# Patient Record
Sex: Female | Born: 1995 | Race: Black or African American | Hispanic: No | Marital: Single | Smoking: Never smoker
Health system: Southern US, Community
[De-identification: ages and names within clinical notes are randomized; demographics above are authoritative.]

## PROBLEM LIST (undated history)

## (undated) DIAGNOSIS — K5900 Constipation, unspecified: Secondary | ICD-10-CM

## (undated) DIAGNOSIS — R002 Palpitations: Secondary | ICD-10-CM

## (undated) HISTORY — DX: Constipation, unspecified: K59.00

## (undated) HISTORY — DX: Palpitations: R00.2

---

## 2016-03-06 DIAGNOSIS — N61 Mastitis without abscess: Secondary | ICD-10-CM | POA: Diagnosis not present

## 2016-04-09 DIAGNOSIS — N61 Mastitis without abscess: Secondary | ICD-10-CM | POA: Diagnosis not present

## 2016-04-22 DIAGNOSIS — Z113 Encounter for screening for infections with a predominantly sexual mode of transmission: Secondary | ICD-10-CM | POA: Diagnosis not present

## 2016-04-22 DIAGNOSIS — Z01419 Encounter for gynecological examination (general) (routine) without abnormal findings: Secondary | ICD-10-CM | POA: Diagnosis not present

## 2016-05-29 DIAGNOSIS — L2084 Intrinsic (allergic) eczema: Secondary | ICD-10-CM | POA: Diagnosis not present

## 2016-10-28 DIAGNOSIS — L929 Granulomatous disorder of the skin and subcutaneous tissue, unspecified: Secondary | ICD-10-CM | POA: Diagnosis not present

## 2016-11-07 DIAGNOSIS — R002 Palpitations: Secondary | ICD-10-CM | POA: Diagnosis not present

## 2016-11-07 DIAGNOSIS — R079 Chest pain, unspecified: Secondary | ICD-10-CM | POA: Diagnosis not present

## 2016-11-14 DIAGNOSIS — R002 Palpitations: Secondary | ICD-10-CM | POA: Diagnosis not present

## 2016-11-18 ENCOUNTER — Telehealth: Payer: Self-pay

## 2016-11-18 NOTE — Telephone Encounter (Signed)
Sent notes to scheduling 

## 2016-12-09 DIAGNOSIS — Z309 Encounter for contraceptive management, unspecified: Secondary | ICD-10-CM | POA: Diagnosis not present

## 2016-12-09 DIAGNOSIS — K219 Gastro-esophageal reflux disease without esophagitis: Secondary | ICD-10-CM | POA: Diagnosis not present

## 2016-12-09 DIAGNOSIS — F411 Generalized anxiety disorder: Secondary | ICD-10-CM | POA: Diagnosis not present

## 2016-12-09 DIAGNOSIS — Z3202 Encounter for pregnancy test, result negative: Secondary | ICD-10-CM | POA: Diagnosis not present

## 2016-12-19 ENCOUNTER — Other Ambulatory Visit: Payer: Self-pay | Admitting: Family Medicine

## 2016-12-19 DIAGNOSIS — Z309 Encounter for contraceptive management, unspecified: Secondary | ICD-10-CM | POA: Diagnosis not present

## 2016-12-19 DIAGNOSIS — E010 Iodine-deficiency related diffuse (endemic) goiter: Secondary | ICD-10-CM

## 2016-12-19 DIAGNOSIS — R634 Abnormal weight loss: Secondary | ICD-10-CM | POA: Diagnosis not present

## 2016-12-19 DIAGNOSIS — K5900 Constipation, unspecified: Secondary | ICD-10-CM | POA: Diagnosis not present

## 2016-12-30 ENCOUNTER — Ambulatory Visit
Admission: RE | Admit: 2016-12-30 | Discharge: 2016-12-30 | Disposition: A | Discharge status: Home / Self Care | Payer: BLUE CROSS/BLUE SHIELD | Source: Ambulatory Visit | Attending: Family Medicine | Admitting: Family Medicine

## 2016-12-30 DIAGNOSIS — E010 Iodine-deficiency related diffuse (endemic) goiter: Secondary | ICD-10-CM | POA: Diagnosis not present

## 2016-12-31 DIAGNOSIS — R079 Chest pain, unspecified: Secondary | ICD-10-CM | POA: Insufficient documentation

## 2016-12-31 DIAGNOSIS — R002 Palpitations: Secondary | ICD-10-CM | POA: Insufficient documentation

## 2017-01-01 ENCOUNTER — Ambulatory Visit (INDEPENDENT_AMBULATORY_CARE_PROVIDER_SITE_OTHER): Payer: BLUE CROSS/BLUE SHIELD | Admitting: Internal Medicine

## 2017-01-01 ENCOUNTER — Encounter: Payer: Self-pay | Admitting: Internal Medicine

## 2017-01-01 ENCOUNTER — Encounter (INDEPENDENT_AMBULATORY_CARE_PROVIDER_SITE_OTHER): Payer: Self-pay

## 2017-01-01 VITALS — BP 110/66 | HR 72 | Ht 66.0 in | Wt 146.4 lb

## 2017-01-01 DIAGNOSIS — K5900 Constipation, unspecified: Secondary | ICD-10-CM

## 2017-01-01 DIAGNOSIS — K219 Gastro-esophageal reflux disease without esophagitis: Secondary | ICD-10-CM | POA: Diagnosis not present

## 2017-01-01 NOTE — Progress Notes (Signed)
Cardiology Office Note   Date:  01/01/2017   ID:  Kaitlyn Fields, DOB 1996-06-18, MRN [id number]  PCP:  Gaye AlkenBARNES,ELIZABETH STEWART, MD  Cardiologist:   Dietrich PatesPaula Maricella Filyaw, MD   Pt referred for palpitations and chest pressure     History of Present Illness: Kaitlyn Fields is a 21 y.o. female with a history of palpitations    Pt woke up in Dec Heart racing  Lasted 5 to 10 min  No dizziness  Hands shaking  Calmed down  COuldnt sleep after.  Tired later next day   No dizziness   Since then has had heart beating flast fut lasting only for a few seconds   No dizziness   Dec  Chest tightness  Pain came sharp or stabbing during first spell of heart racing   Since then can have at any time  No exacerbating factors  Last about 10 to 20 sec .  NO SOB   Doe have a lot of burping  Food/water will come with it  Has had GI symptoms since December   Has had problems with BMs  Took Mag then Miralax  Miralax is Helping  When not having spells is OK  Doesn't exercise regularly but is active as student No CP with activity     Current Meds  Medication Sig  . calcium carbonate (TUMS - DOSED IN MG ELEMENTAL CALCIUM) 500 MG chewable tablet Chew 1 tablet by mouth as directed.  . Norethindrone-Ethinyl Estradiol-Fe Biphas (LO LOESTRIN FE) 1 MG-10 MCG / 10 MCG tablet Take 1 tablet by mouth daily.  . polyethylene glycol (MIRALAX / GLYCOLAX) packet Take 17 g by mouth daily as needed.     Allergies:   Patient has no known allergies.   Past Medical History:  Diagnosis Date  . Constipation   . Palpitations     History reviewed. No pertinent surgical history.   Social History:  The patient  reports that she has never smoked. She has never used smokeless tobacco. She reports that she does not drink alcohol or use drugs.   Family History:  The patient's family history includes Diabetes in her father and maternal grandfather; Healthy in her brother and sister; Hypertension in her father.    ROS:  Please see the  history of present illness. All other systems are reviewed and  Negative to the above problem except as noted.    PHYSICAL EXAM: VS:  BP 110/66   Pulse 72   Ht 5\' 6"  (1.676 m)   Wt 146 lb 6.4 oz (66.4 kg)   BMI 23.63 kg/m   GEN: Well nourished, well developed, in no acute distress  HEENT: normal  Neck: no JVD, carotid bruits, or masses Cardiac: RRR; no murmurs, rubs, or gallops,no edema  Respiratory:  clear to auscultation bilaterally, normal work of breathing GI: soft, nontender, nondistended, + BS  No hepatomegaly  MS: no deformity Moving all extremities   Skin: warm and dry, no rash Neuro:  Strength and sensation are intact Psych: euthymic mood, full affect   EKG:  EKG is ordered today.SR 72 bpm     Lipid Panel No results found for: CHOL, TRIG, HDL, CHOLHDL, VLDL, LDLCALC, LDLDIRECT    Wt Readings from Last 3 Encounters:  01/01/17 146 lb 6.4 oz (66.4 kg)      ASSESSMENT AND PLAN:  1  Palpitations  I am not convinced this represents a signif arrhythmia  Short spells   I would encourage her to stay hydrated,  stay active   If she has any longer spells like the first spell she had then I would set up for an event monitor.    2 Chest tightness  This does not sound like cardiac   Sound GI in origin I woud recom referred to GI to evaluate reflux    Will be available if develops prolonged symptoms palpitations     Current medicines are reviewed at length with the patient today.  The patient does not have concerns regarding medicines.  Signed, Dietrich Pates, MD  01/01/2017 4:09 PM    [facility] [address] [place], [place], [place]  [address] Phone: [phone]; Fax: [phone]

## 2017-01-01 NOTE — Patient Instructions (Signed)
Please call back if palpitations continue or worsen. Dr. Tenny Crawoss recommends that you follow up with gastroenterology.

## 2017-01-12 ENCOUNTER — Ambulatory Visit: Payer: Self-pay | Admitting: Cardiology

## 2017-01-19 DIAGNOSIS — K219 Gastro-esophageal reflux disease without esophagitis: Secondary | ICD-10-CM | POA: Diagnosis not present

## 2017-01-19 DIAGNOSIS — K5900 Constipation, unspecified: Secondary | ICD-10-CM | POA: Diagnosis not present

## 2017-02-23 DIAGNOSIS — K219 Gastro-esophageal reflux disease without esophagitis: Secondary | ICD-10-CM | POA: Diagnosis not present

## 2017-02-23 DIAGNOSIS — K5900 Constipation, unspecified: Secondary | ICD-10-CM | POA: Diagnosis not present

## 2017-04-17 ENCOUNTER — Other Ambulatory Visit: Payer: Self-pay | Admitting: Gastroenterology

## 2017-04-17 DIAGNOSIS — K5900 Constipation, unspecified: Secondary | ICD-10-CM | POA: Diagnosis not present

## 2017-04-17 DIAGNOSIS — K219 Gastro-esophageal reflux disease without esophagitis: Secondary | ICD-10-CM | POA: Diagnosis not present

## 2017-04-23 ENCOUNTER — Other Ambulatory Visit: Payer: BLUE CROSS/BLUE SHIELD

## 2017-04-28 ENCOUNTER — Other Ambulatory Visit: Payer: BLUE CROSS/BLUE SHIELD

## 2017-05-05 ENCOUNTER — Ambulatory Visit
Admission: RE | Admit: 2017-05-05 | Discharge: 2017-05-05 | Disposition: A | Discharge status: Home / Self Care | Payer: BLUE CROSS/BLUE SHIELD | Source: Ambulatory Visit | Attending: Gastroenterology | Admitting: Gastroenterology

## 2017-05-05 DIAGNOSIS — K5900 Constipation, unspecified: Secondary | ICD-10-CM | POA: Diagnosis not present

## 2017-05-11 ENCOUNTER — Ambulatory Visit
Admission: RE | Admit: 2017-05-11 | Discharge: 2017-05-11 | Disposition: A | Discharge status: Home / Self Care | Payer: BLUE CROSS/BLUE SHIELD | Source: Ambulatory Visit | Attending: Gastroenterology | Admitting: Gastroenterology

## 2017-05-11 DIAGNOSIS — K5900 Constipation, unspecified: Secondary | ICD-10-CM

## 2017-05-11 DIAGNOSIS — K219 Gastro-esophageal reflux disease without esophagitis: Secondary | ICD-10-CM | POA: Diagnosis not present

## 2017-05-19 ENCOUNTER — Other Ambulatory Visit (HOSPITAL_COMMUNITY)
Admission: RE | Admit: 2017-05-19 | Discharge: 2017-05-19 | Disposition: A | Discharge status: Home / Self Care | Payer: BLUE CROSS/BLUE SHIELD | Source: Ambulatory Visit | Attending: Nurse Practitioner | Admitting: Nurse Practitioner

## 2017-05-19 ENCOUNTER — Other Ambulatory Visit: Payer: Self-pay | Admitting: Nurse Practitioner

## 2017-05-19 DIAGNOSIS — Z113 Encounter for screening for infections with a predominantly sexual mode of transmission: Secondary | ICD-10-CM | POA: Diagnosis not present

## 2017-05-19 DIAGNOSIS — Z01419 Encounter for gynecological examination (general) (routine) without abnormal findings: Secondary | ICD-10-CM | POA: Diagnosis not present

## 2017-05-19 DIAGNOSIS — Z124 Encounter for screening for malignant neoplasm of cervix: Secondary | ICD-10-CM | POA: Diagnosis not present

## 2017-05-19 DIAGNOSIS — R42 Dizziness and giddiness: Secondary | ICD-10-CM | POA: Diagnosis not present

## 2017-05-19 DIAGNOSIS — R634 Abnormal weight loss: Secondary | ICD-10-CM | POA: Diagnosis not present

## 2017-05-21 LAB — CYTOLOGY - PAP
Chlamydia: NEGATIVE
DIAGNOSIS: NEGATIVE
Neisseria Gonorrhea: NEGATIVE

## 2017-05-29 DIAGNOSIS — K219 Gastro-esophageal reflux disease without esophagitis: Secondary | ICD-10-CM | POA: Diagnosis not present

## 2017-09-28 ENCOUNTER — Other Ambulatory Visit: Payer: Self-pay | Admitting: Family Medicine

## 2017-09-28 DIAGNOSIS — N63 Unspecified lump in unspecified breast: Secondary | ICD-10-CM

## 2017-10-05 ENCOUNTER — Other Ambulatory Visit: Payer: BLUE CROSS/BLUE SHIELD

## 2018-02-27 DIAGNOSIS — R42 Dizziness and giddiness: Secondary | ICD-10-CM | POA: Diagnosis not present

## 2018-02-27 DIAGNOSIS — E559 Vitamin D deficiency, unspecified: Secondary | ICD-10-CM | POA: Diagnosis not present

## 2018-02-27 DIAGNOSIS — R197 Diarrhea, unspecified: Secondary | ICD-10-CM | POA: Diagnosis not present

## 2018-02-27 DIAGNOSIS — R05 Cough: Secondary | ICD-10-CM | POA: Diagnosis not present

## 2018-02-27 DIAGNOSIS — E538 Deficiency of other specified B group vitamins: Secondary | ICD-10-CM | POA: Diagnosis not present

## 2018-02-27 DIAGNOSIS — R110 Nausea: Secondary | ICD-10-CM | POA: Diagnosis not present

## 2018-06-22 DIAGNOSIS — Z01419 Encounter for gynecological examination (general) (routine) without abnormal findings: Secondary | ICD-10-CM | POA: Diagnosis not present

## 2018-06-22 DIAGNOSIS — Z113 Encounter for screening for infections with a predominantly sexual mode of transmission: Secondary | ICD-10-CM | POA: Diagnosis not present

## 2018-12-31 DIAGNOSIS — H00016 Hordeolum externum left eye, unspecified eyelid: Secondary | ICD-10-CM | POA: Diagnosis not present

## 2018-12-31 DIAGNOSIS — Z7189 Other specified counseling: Secondary | ICD-10-CM | POA: Diagnosis not present

## 2019-03-20 IMAGING — RF DG UGI W/ HIGH DENSITY W/KUB
7 of 8 series · 14 of 24 positions shown · non-contrast
Comparison: None in PACs

CLINICAL DATA: Constipation, gastroesophageal reflux.

EXAM:
UPPER GI SERIES WITH KUB
TECHNIQUE: After obtaining a scout radiograph a routine upper GI series was
performed using thin and high density barium. Effervescent crystals
and a barium tablet were administered.
FLUOROSCOPY TIME:  Fluoroscopy Time:  1 minutes, 42 seconds
Radiation Exposure Index (if provided by the fluoroscopic device):
89 mGy
Number of Acquired Spot Images: 5 spot and 5 sequences

[Series 1: one shot · 1 of 1 slices shown (1 of 2)]
[im 1/1]
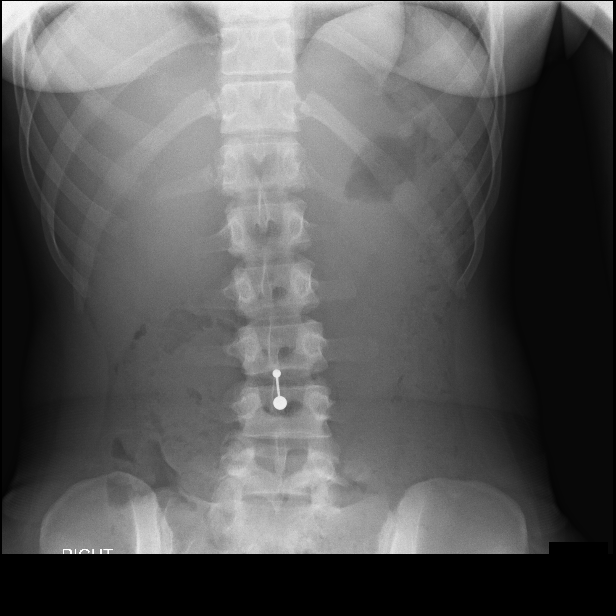

[Series 2: sequence · 2 of 15 frames shown (1 of 5)]
[frame 3/15]
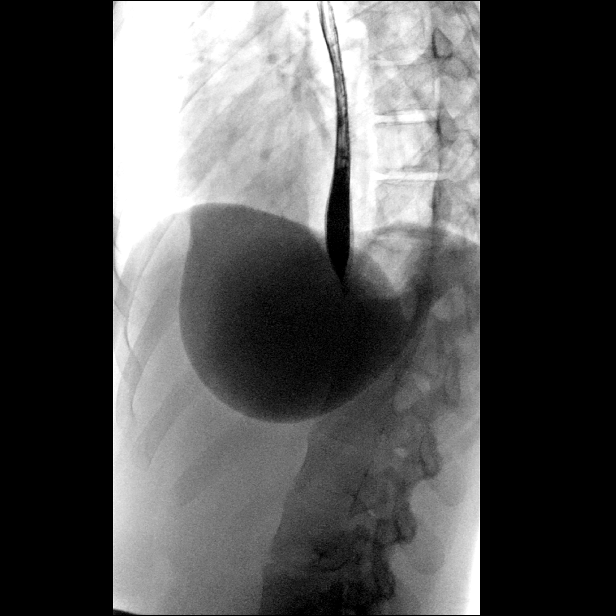
[frame 13/15]
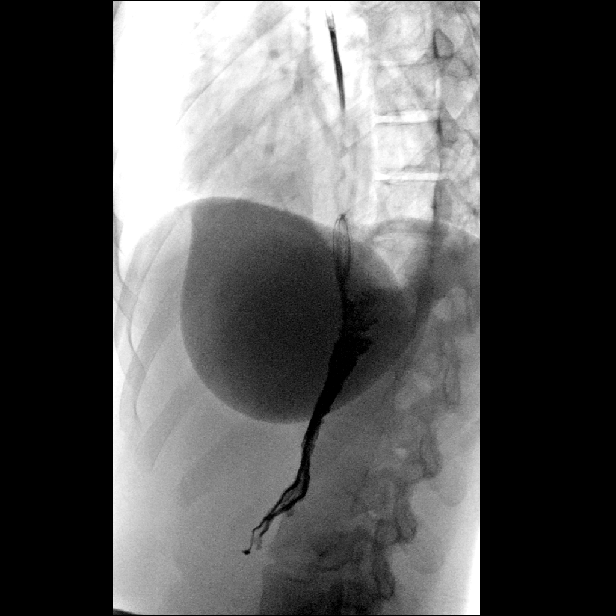

[Series 3: sequence · 2 of 20 frames shown (2 of 5)]
[frame 11/20]
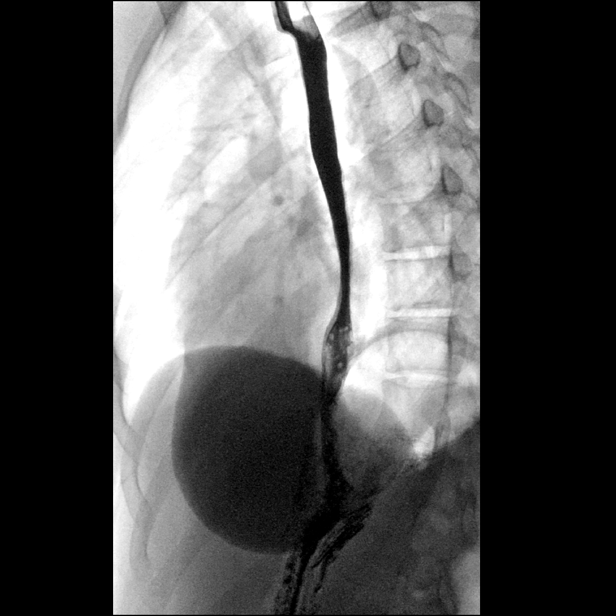
[frame 14/20]
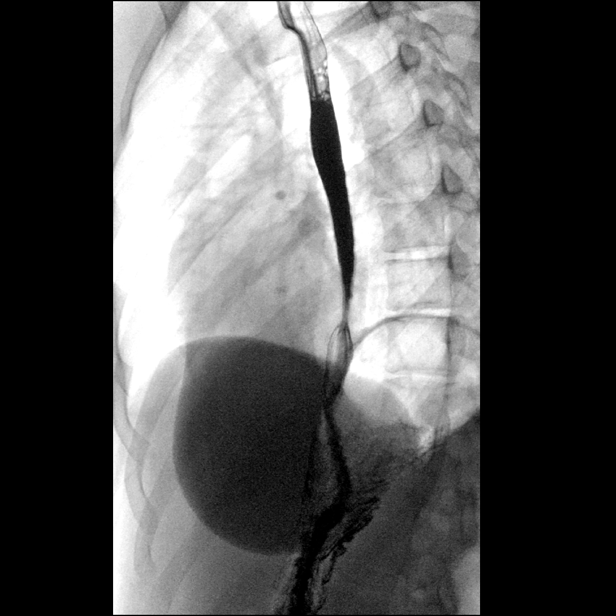

[Series 4: one shot · 2 of 4 slices shown (2 of 2)]
[im 1/4]
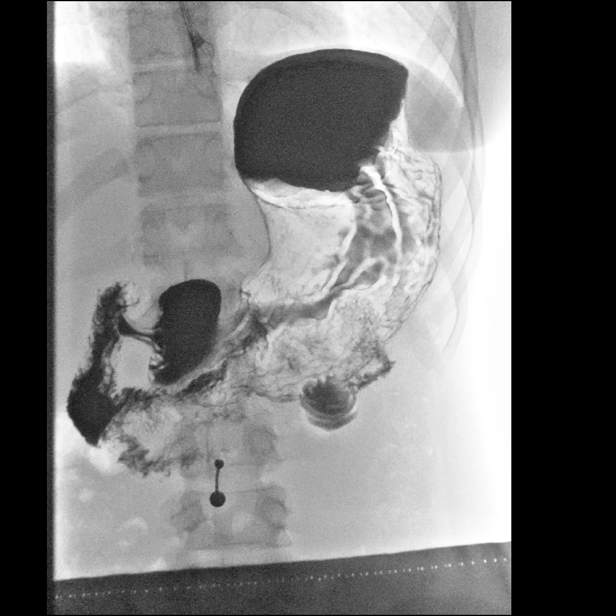
[im 3/4]
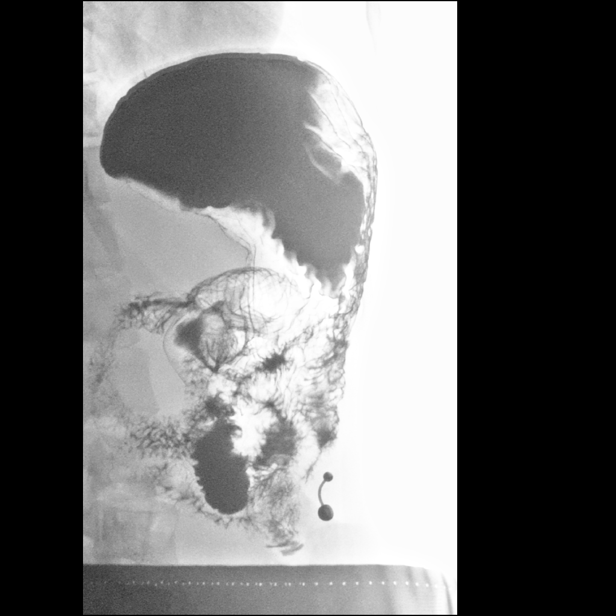

[Series 5: sequence · 2 of 27 frames shown (3 of 5)]
[frame 5/27]
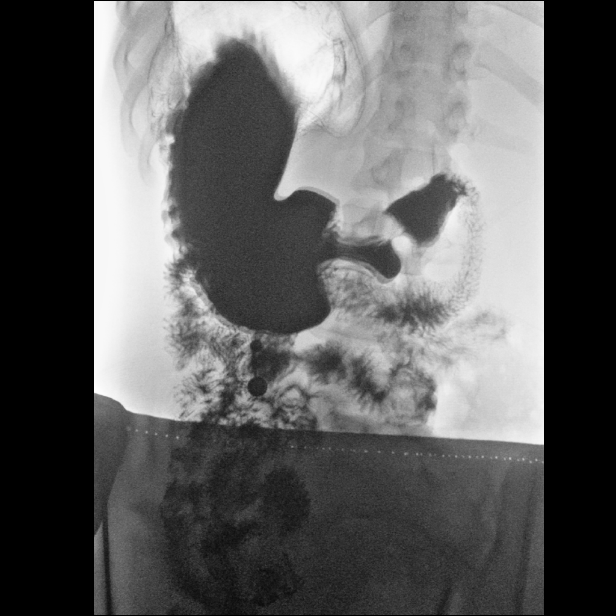
[frame 23/27]
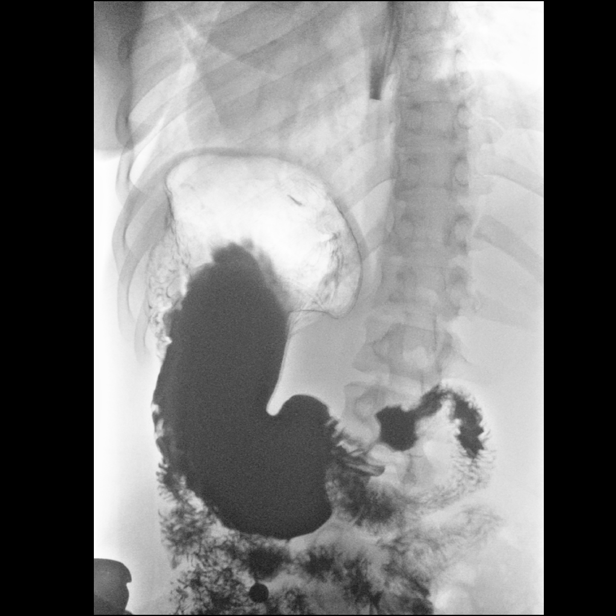

[Series 6: sequence · 3 of 60 frames shown (4 of 5)]
[frame 10/60]
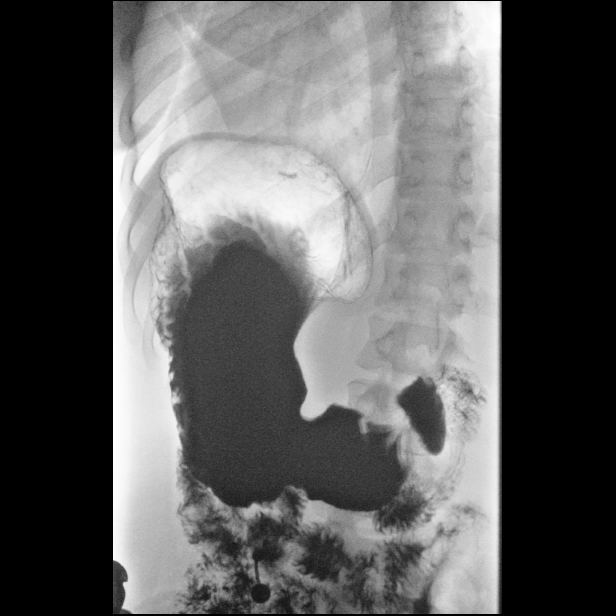
[frame 31/60]
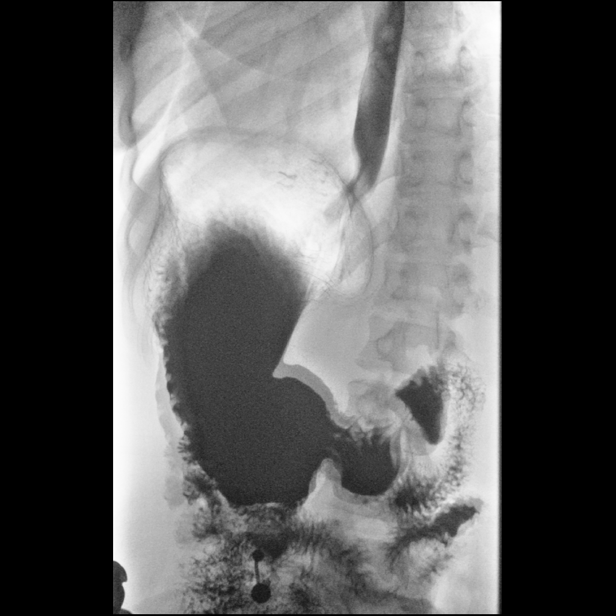
[frame 52/60]
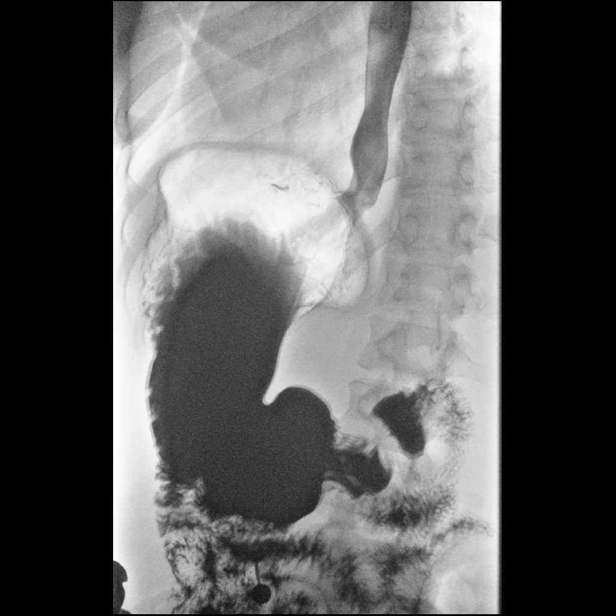

[Series 8: sequence · 2 of 28 frames shown (5 of 5)]
[frame 5/28]
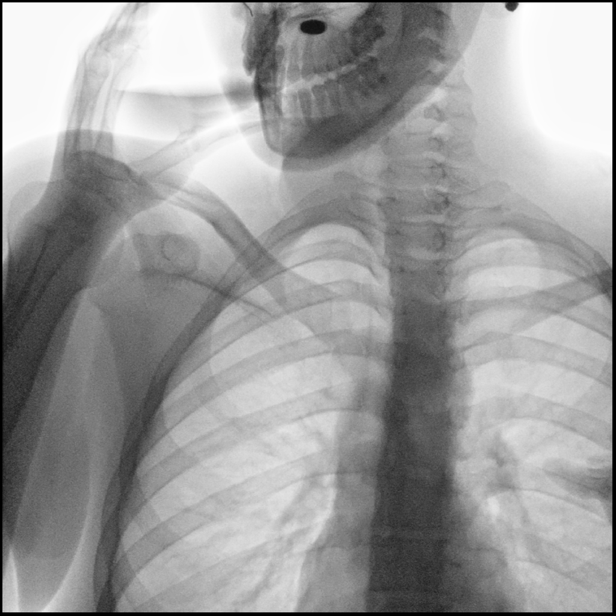
[frame 24/28]
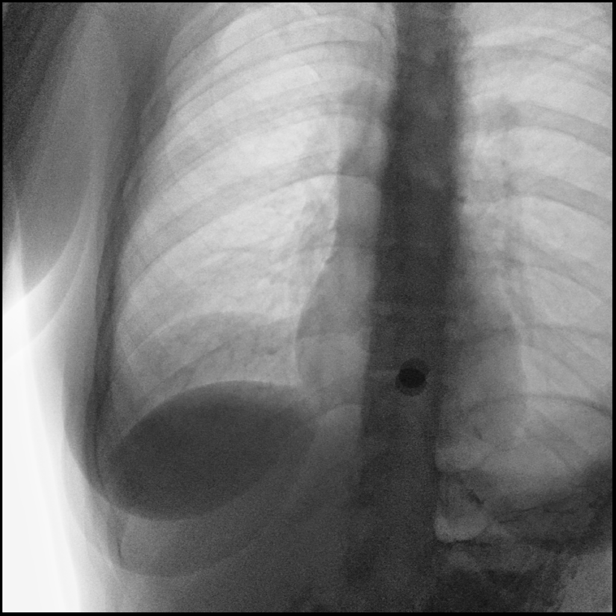

[14 of 24 positions shown; findings below may reference images not displayed]

FINDINGS: The scout film reveals a moderate amount of stool within the colon.
Barium is present in the appendix.

The patient ingested thick and thin barium and gas forming crystals
without difficulty. The esophagus distended well. There was no
hiatal hernia. There was no gastroesophageal reflux. There was no
evidence of esophagitis. The stomach distended well. The mucosal
fold pattern was normal gastric emptying was prompt. The duodenal
bulb and C-sweep were normal. No ulcer niche was observed. In the
prone position esophageal motility was normal. No reflux was
observed. The barium tablet passed without difficulty.

No evidence of reflux, esophagitis, or hiatal hernia.

Normal appearance of the stomach and duodenum.

## 2019-06-07 DIAGNOSIS — K146 Glossodynia: Secondary | ICD-10-CM | POA: Diagnosis not present

## 2019-06-27 DIAGNOSIS — Z20828 Contact with and (suspected) exposure to other viral communicable diseases: Secondary | ICD-10-CM | POA: Diagnosis not present

## 2019-07-14 DIAGNOSIS — B373 Candidiasis of vulva and vagina: Secondary | ICD-10-CM | POA: Diagnosis not present

## 2019-07-21 DIAGNOSIS — Z01419 Encounter for gynecological examination (general) (routine) without abnormal findings: Secondary | ICD-10-CM | POA: Diagnosis not present

## 2019-08-01 DIAGNOSIS — B373 Candidiasis of vulva and vagina: Secondary | ICD-10-CM | POA: Diagnosis not present

## 2019-08-26 DIAGNOSIS — N760 Acute vaginitis: Secondary | ICD-10-CM | POA: Diagnosis not present

## 2019-09-22 DIAGNOSIS — B373 Candidiasis of vulva and vagina: Secondary | ICD-10-CM | POA: Diagnosis not present

## 2019-09-22 DIAGNOSIS — Z131 Encounter for screening for diabetes mellitus: Secondary | ICD-10-CM | POA: Diagnosis not present

## 2019-09-22 DIAGNOSIS — N760 Acute vaginitis: Secondary | ICD-10-CM | POA: Diagnosis not present

## 2020-03-13 DIAGNOSIS — Z117 Encounter for testing for latent tuberculosis infection: Secondary | ICD-10-CM | POA: Diagnosis not present

## 2020-03-13 DIAGNOSIS — Z0000 Encounter for general adult medical examination without abnormal findings: Secondary | ICD-10-CM | POA: Diagnosis not present

## 2020-03-13 DIAGNOSIS — Z136 Encounter for screening for cardiovascular disorders: Secondary | ICD-10-CM | POA: Diagnosis not present

## 2020-05-03 DIAGNOSIS — Z23 Encounter for immunization: Secondary | ICD-10-CM | POA: Diagnosis not present

## 2020-05-03 DIAGNOSIS — Z793 Long term (current) use of hormonal contraceptives: Secondary | ICD-10-CM | POA: Diagnosis not present

## 2020-05-03 DIAGNOSIS — Z202 Contact with and (suspected) exposure to infections with a predominantly sexual mode of transmission: Secondary | ICD-10-CM | POA: Diagnosis not present

## 2020-05-03 DIAGNOSIS — N760 Acute vaginitis: Secondary | ICD-10-CM | POA: Diagnosis not present

## 2020-05-03 DIAGNOSIS — Z124 Encounter for screening for malignant neoplasm of cervix: Secondary | ICD-10-CM | POA: Diagnosis not present

## 2020-05-15 DIAGNOSIS — Z20822 Contact with and (suspected) exposure to covid-19: Secondary | ICD-10-CM | POA: Diagnosis not present

## 2020-05-15 DIAGNOSIS — Z03818 Encounter for observation for suspected exposure to other biological agents ruled out: Secondary | ICD-10-CM | POA: Diagnosis not present

## 2020-06-14 DIAGNOSIS — M542 Cervicalgia: Secondary | ICD-10-CM | POA: Diagnosis not present

## 2020-06-14 DIAGNOSIS — R42 Dizziness and giddiness: Secondary | ICD-10-CM | POA: Diagnosis not present

## 2020-06-14 DIAGNOSIS — Z20822 Contact with and (suspected) exposure to covid-19: Secondary | ICD-10-CM | POA: Diagnosis not present

## 2020-06-14 DIAGNOSIS — M79602 Pain in left arm: Secondary | ICD-10-CM | POA: Diagnosis not present

## 2020-06-15 DIAGNOSIS — T50Z95A Adverse effect of other vaccines and biological substances, initial encounter: Secondary | ICD-10-CM | POA: Diagnosis not present

## 2020-06-15 DIAGNOSIS — R519 Headache, unspecified: Secondary | ICD-10-CM | POA: Diagnosis not present

## 2020-06-15 DIAGNOSIS — M791 Myalgia, unspecified site: Secondary | ICD-10-CM | POA: Diagnosis not present

## 2020-06-15 DIAGNOSIS — M436 Torticollis: Secondary | ICD-10-CM | POA: Diagnosis not present

## 2020-06-15 DIAGNOSIS — K219 Gastro-esophageal reflux disease without esophagitis: Secondary | ICD-10-CM | POA: Diagnosis not present

## 2020-06-18 DIAGNOSIS — H6122 Impacted cerumen, left ear: Secondary | ICD-10-CM | POA: Diagnosis not present

## 2020-06-18 DIAGNOSIS — R42 Dizziness and giddiness: Secondary | ICD-10-CM | POA: Diagnosis not present

## 2020-06-18 DIAGNOSIS — J392 Other diseases of pharynx: Secondary | ICD-10-CM | POA: Diagnosis not present

## 2020-09-07 DIAGNOSIS — Z20822 Contact with and (suspected) exposure to covid-19: Secondary | ICD-10-CM | POA: Diagnosis not present

## 2020-10-23 DIAGNOSIS — N898 Other specified noninflammatory disorders of vagina: Secondary | ICD-10-CM | POA: Diagnosis not present

## 2020-10-23 DIAGNOSIS — Z113 Encounter for screening for infections with a predominantly sexual mode of transmission: Secondary | ICD-10-CM | POA: Diagnosis not present

## 2020-10-23 DIAGNOSIS — N760 Acute vaginitis: Secondary | ICD-10-CM | POA: Diagnosis not present

## 2020-10-23 DIAGNOSIS — R195 Other fecal abnormalities: Secondary | ICD-10-CM | POA: Diagnosis not present

## 2023-08-26 ENCOUNTER — Emergency Department (HOSPITAL_BASED_OUTPATIENT_CLINIC_OR_DEPARTMENT_OTHER)
Admission: EM | Admit: 2023-08-26 | Discharge: 2023-08-26 | Disposition: A | Discharge status: Home / Self Care | Payer: BC Managed Care – PPO | Attending: Emergency Medicine | Admitting: Emergency Medicine

## 2023-08-26 ENCOUNTER — Other Ambulatory Visit: Payer: Self-pay

## 2023-08-26 ENCOUNTER — Encounter (HOSPITAL_BASED_OUTPATIENT_CLINIC_OR_DEPARTMENT_OTHER): Payer: Self-pay | Admitting: Pediatrics

## 2023-08-26 DIAGNOSIS — S161XXA Strain of muscle, fascia and tendon at neck level, initial encounter: Secondary | ICD-10-CM | POA: Insufficient documentation

## 2023-08-26 DIAGNOSIS — Y92410 Unspecified street and highway as the place of occurrence of the external cause: Secondary | ICD-10-CM | POA: Insufficient documentation

## 2023-08-26 DIAGNOSIS — S0990XA Unspecified injury of head, initial encounter: Secondary | ICD-10-CM | POA: Insufficient documentation

## 2023-08-26 MED ORDER — CYCLOBENZAPRINE HCL 10 MG PO TABS: 10.0000 mg | ORAL_TABLET | Freq: Two times a day (BID) | ORAL | 0 refills | Status: AC | PRN
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
